# Patient Record
Sex: Female | Born: 1950 | Hispanic: Yes | Smoking: Never smoker
Health system: Southern US, Community
[De-identification: ages and names within clinical notes are randomized; demographics above are authoritative.]

## PROBLEM LIST (undated history)

## (undated) DIAGNOSIS — I10 Essential (primary) hypertension: Secondary | ICD-10-CM

## (undated) DIAGNOSIS — E119 Type 2 diabetes mellitus without complications: Secondary | ICD-10-CM

## (undated) DIAGNOSIS — E7800 Pure hypercholesterolemia, unspecified: Secondary | ICD-10-CM

## (undated) HISTORY — PX: ABDOMINAL HYSTERECTOMY: SHX81

## (undated) HISTORY — PX: CATARACT EXTRACTION: SUR2

---

## 2007-11-16 ENCOUNTER — Ambulatory Visit: Payer: Self-pay | Admitting: Family Medicine

## 2010-05-15 ENCOUNTER — Ambulatory Visit: Payer: Self-pay

## 2012-02-24 ENCOUNTER — Emergency Department: Payer: Self-pay | Admitting: Emergency Medicine

## 2012-04-27 ENCOUNTER — Ambulatory Visit: Payer: Self-pay

## 2013-01-11 ENCOUNTER — Emergency Department: Payer: Self-pay | Admitting: Emergency Medicine

## 2013-05-20 ENCOUNTER — Ambulatory Visit: Payer: Self-pay | Admitting: Family Medicine

## 2013-07-09 ENCOUNTER — Emergency Department: Payer: Self-pay | Admitting: Emergency Medicine

## 2013-07-09 LAB — COMPREHENSIVE METABOLIC PANEL
Alkaline Phosphatase: 72 U/L (ref 50–136)
Anion Gap: 2 — ABNORMAL LOW (ref 7–16)
BUN: 24 mg/dL — ABNORMAL HIGH (ref 7–18)
Bilirubin,Total: 0.3 mg/dL (ref 0.2–1.0)
Chloride: 107 mmol/L (ref 98–107)
Co2: 33 mmol/L — ABNORMAL HIGH (ref 21–32)
EGFR (African American): >60
EGFR (Non-African Amer.): >60
Glucose: 137 mg/dL — ABNORMAL HIGH (ref 65–99)
Osmolality: 289 (ref 275–301)
Potassium: 3.8 mmol/L (ref 3.5–5.1)
SGOT(AST): 22 U/L (ref 15–37)
Sodium: 142 mmol/L (ref 136–145)
Total Protein: 7.5 g/dL (ref 6.4–8.2)

## 2013-07-09 LAB — CBC WITH DIFFERENTIAL/PLATELET
Basophil %: 0.6 %
Eosinophil %: 4 %
HCT: 40.5 % (ref 35.0–47.0)
Lymphocyte #: 2.6 10*3/uL (ref 1.0–3.6)
Lymphocyte %: 33.1 %
MCH: 29.4 pg (ref 26.0–34.0)
MCV: 88 fL (ref 80–100)
Monocyte %: 9.3 %
Neutrophil #: 4.1 10*3/uL (ref 1.4–6.5)
Platelet: 212 10*3/uL (ref 150–440)
RDW: 13.6 % (ref 11.5–14.5)
WBC: 7.8 10*3/uL (ref 3.6–11.0)

## 2013-07-09 LAB — TROPONIN I
Troponin-I: <0.02 ng/mL
Troponin-I: <0.02 ng/mL

## 2014-07-24 ENCOUNTER — Ambulatory Visit: Payer: Self-pay | Admitting: Family Medicine

## 2016-10-28 ENCOUNTER — Other Ambulatory Visit: Payer: Self-pay | Admitting: Family Medicine

## 2016-10-28 DIAGNOSIS — Z1231 Encounter for screening mammogram for malignant neoplasm of breast: Secondary | ICD-10-CM

## 2016-12-04 ENCOUNTER — Ambulatory Visit
Admission: RE | Admit: 2016-12-04 | Discharge: 2016-12-04 | Disposition: A | Discharge status: Home / Self Care | Payer: BLUE CROSS/BLUE SHIELD | Source: Ambulatory Visit | Attending: Family Medicine | Admitting: Family Medicine

## 2016-12-04 DIAGNOSIS — Z1231 Encounter for screening mammogram for malignant neoplasm of breast: Secondary | ICD-10-CM

## 2017-04-30 ENCOUNTER — Other Ambulatory Visit: Payer: Self-pay | Admitting: Family Medicine

## 2017-04-30 DIAGNOSIS — Z13820 Encounter for screening for osteoporosis: Secondary | ICD-10-CM

## 2017-06-24 ENCOUNTER — Other Ambulatory Visit: Payer: BLUE CROSS/BLUE SHIELD

## 2017-11-09 ENCOUNTER — Other Ambulatory Visit: Payer: Self-pay | Admitting: Family Medicine

## 2017-11-09 DIAGNOSIS — Z1231 Encounter for screening mammogram for malignant neoplasm of breast: Secondary | ICD-10-CM

## 2017-12-17 ENCOUNTER — Ambulatory Visit
Admission: RE | Admit: 2017-12-17 | Discharge: 2017-12-17 | Disposition: A | Discharge status: Home / Self Care | Payer: Medicare Other | Source: Ambulatory Visit | Attending: Family Medicine | Admitting: Family Medicine

## 2017-12-17 ENCOUNTER — Encounter: Payer: Self-pay | Admitting: Radiology

## 2017-12-17 DIAGNOSIS — Z13820 Encounter for screening for osteoporosis: Secondary | ICD-10-CM | POA: Diagnosis present

## 2017-12-17 DIAGNOSIS — Z1231 Encounter for screening mammogram for malignant neoplasm of breast: Secondary | ICD-10-CM | POA: Insufficient documentation

## 2017-12-17 DIAGNOSIS — M85852 Other specified disorders of bone density and structure, left thigh: Secondary | ICD-10-CM | POA: Diagnosis not present

## 2017-12-17 DIAGNOSIS — M8588 Other specified disorders of bone density and structure, other site: Secondary | ICD-10-CM | POA: Diagnosis not present

## 2017-12-17 DIAGNOSIS — Z780 Asymptomatic menopausal state: Secondary | ICD-10-CM | POA: Diagnosis not present

## 2018-07-20 ENCOUNTER — Other Ambulatory Visit: Payer: Self-pay | Admitting: Family Medicine

## 2018-07-20 DIAGNOSIS — Z1231 Encounter for screening mammogram for malignant neoplasm of breast: Secondary | ICD-10-CM

## 2018-12-21 ENCOUNTER — Ambulatory Visit
Admission: RE | Admit: 2018-12-21 | Discharge: 2018-12-21 | Disposition: A | Discharge status: Home / Self Care | Payer: Medicare Other | Source: Ambulatory Visit | Attending: Family Medicine | Admitting: Family Medicine

## 2018-12-21 DIAGNOSIS — Z1231 Encounter for screening mammogram for malignant neoplasm of breast: Secondary | ICD-10-CM | POA: Insufficient documentation

## 2019-11-16 ENCOUNTER — Other Ambulatory Visit: Payer: Self-pay | Admitting: Family Medicine

## 2019-11-16 DIAGNOSIS — Z1231 Encounter for screening mammogram for malignant neoplasm of breast: Secondary | ICD-10-CM

## 2020-01-20 ENCOUNTER — Ambulatory Visit
Admission: RE | Admit: 2020-01-20 | Discharge: 2020-01-20 | Disposition: A | Discharge status: Home / Self Care | Payer: Medicare Other | Source: Ambulatory Visit | Attending: Family Medicine | Admitting: Family Medicine

## 2020-01-20 DIAGNOSIS — Z1231 Encounter for screening mammogram for malignant neoplasm of breast: Secondary | ICD-10-CM | POA: Insufficient documentation

## 2020-03-01 DIAGNOSIS — H25811 Combined forms of age-related cataract, right eye: Secondary | ICD-10-CM | POA: Insufficient documentation

## 2020-08-15 ENCOUNTER — Ambulatory Visit: Payer: Medicare Other | Admitting: Dermatology

## 2020-11-01 ENCOUNTER — Other Ambulatory Visit: Payer: Self-pay | Admitting: Family Medicine

## 2020-11-01 DIAGNOSIS — D249 Benign neoplasm of unspecified breast: Secondary | ICD-10-CM

## 2020-11-19 ENCOUNTER — Ambulatory Visit
Admission: RE | Admit: 2020-11-19 | Discharge: 2020-11-19 | Disposition: A | Discharge status: Home / Self Care | Payer: Medicare Other | Source: Ambulatory Visit | Attending: Family Medicine | Admitting: Family Medicine

## 2020-11-19 ENCOUNTER — Other Ambulatory Visit: Payer: Self-pay

## 2020-11-19 DIAGNOSIS — D1779 Benign lipomatous neoplasm of other sites: Secondary | ICD-10-CM | POA: Insufficient documentation

## 2020-11-19 DIAGNOSIS — D242 Benign neoplasm of left breast: Secondary | ICD-10-CM | POA: Diagnosis not present

## 2020-11-19 DIAGNOSIS — D249 Benign neoplasm of unspecified breast: Secondary | ICD-10-CM

## 2020-11-19 DIAGNOSIS — N6320 Unspecified lump in the left breast, unspecified quadrant: Secondary | ICD-10-CM | POA: Diagnosis present

## 2020-11-29 ENCOUNTER — Other Ambulatory Visit: Payer: Self-pay | Admitting: Family Medicine

## 2020-11-29 DIAGNOSIS — N63 Unspecified lump in unspecified breast: Secondary | ICD-10-CM

## 2021-10-25 ENCOUNTER — Other Ambulatory Visit: Payer: Self-pay | Admitting: Family Medicine

## 2021-10-25 DIAGNOSIS — Z1231 Encounter for screening mammogram for malignant neoplasm of breast: Secondary | ICD-10-CM

## 2021-11-29 ENCOUNTER — Other Ambulatory Visit: Payer: Self-pay

## 2021-11-29 ENCOUNTER — Ambulatory Visit
Admission: RE | Admit: 2021-11-29 | Discharge: 2021-11-29 | Disposition: A | Discharge status: Home / Self Care | Payer: Medicare HMO | Source: Ambulatory Visit | Attending: Family Medicine | Admitting: Family Medicine

## 2021-11-29 DIAGNOSIS — Z1231 Encounter for screening mammogram for malignant neoplasm of breast: Secondary | ICD-10-CM | POA: Diagnosis not present

## 2022-02-11 ENCOUNTER — Emergency Department
Admission: EM | Admit: 2022-02-11 | Discharge: 2022-02-11 | Disposition: A | Discharge status: Home / Self Care | Payer: Medicare HMO | Attending: Emergency Medicine | Admitting: Emergency Medicine

## 2022-02-11 ENCOUNTER — Emergency Department: Payer: Medicare HMO

## 2022-02-11 ENCOUNTER — Other Ambulatory Visit: Payer: Self-pay

## 2022-02-11 DIAGNOSIS — R220 Localized swelling, mass and lump, head: Secondary | ICD-10-CM | POA: Insufficient documentation

## 2022-02-11 DIAGNOSIS — R519 Headache, unspecified: Secondary | ICD-10-CM

## 2022-02-11 LAB — CBC WITH DIFFERENTIAL/PLATELET
Abs Immature Granulocytes: 0.02 10*3/uL (ref 0.00–0.07)
Basophils Absolute: 0 10*3/uL (ref 0.0–0.1)
Basophils Relative: 0 %
Eosinophils Absolute: 0.1 10*3/uL (ref 0.0–0.5)
Eosinophils Relative: 1 %
HCT: 39.4 % (ref 36.0–46.0)
Hemoglobin: 13 g/dL (ref 12.0–15.0)
Immature Granulocytes: 0 %
Lymphocytes Relative: 22 %
Lymphs Abs: 1.5 10*3/uL (ref 0.7–4.0)
MCH: 28.1 pg (ref 26.0–34.0)
MCHC: 33 g/dL (ref 30.0–36.0)
MCV: 85.1 fL (ref 80.0–100.0)
Monocytes Absolute: 0.4 10*3/uL (ref 0.1–1.0)
Monocytes Relative: 6 %
Neutro Abs: 4.8 10*3/uL (ref 1.7–7.7)
Neutrophils Relative %: 71 %
Platelets: 231 10*3/uL (ref 150–400)
RBC: 4.63 MIL/uL (ref 3.87–5.11)
RDW: 13.1 % (ref 11.5–15.5)
WBC: 6.8 10*3/uL (ref 4.0–10.5)
nRBC: 0 % (ref 0.0–0.2)

## 2022-02-11 LAB — BASIC METABOLIC PANEL
Anion gap: 8 (ref 5–15)
BUN: 14 mg/dL (ref 8–23)
CO2: 24 mmol/L (ref 22–32)
Calcium: 9.5 mg/dL (ref 8.9–10.3)
Chloride: 110 mmol/L (ref 98–111)
Creatinine, Ser: 0.76 mg/dL (ref 0.44–1.00)
GFR, Estimated: >60 mL/min (ref >60)
Glucose, Bld: 162 mg/dL — ABNORMAL HIGH (ref 70–99)
Potassium: 3.9 mmol/L (ref 3.5–5.1)
Sodium: 142 mmol/L (ref 135–145)

## 2022-02-11 NOTE — Discharge Instructions (Addendum)
Please continue to follow-up with your dentist.  Also follow-up with primary care.  There is a mucous cyst in the sinus but this does not usually cause any problems at all.  I do not think that this is causing the swelling.  Your blood work is normal and the CT does not show anything else.  I do not think there is any horrible problem going on but as I said your dentist and your primary care doctor should keep an eye on you.  Please return for any worsening.

## 2022-02-11 NOTE — ED Provider Notes (Signed)
[facility] Provider Note    Event Date/Time   First MD Initiated Contact with Patient 02/11/22 1100     (approximate)   History   Facial Swelling   HPI  Kaitlin Maxwell is a 71 y.o. female who complains of several weeks of left-sided facial swelling in the cheek going up to the ear.  She says she has some pain in her anterior incisor when she wears it bite guard at night but this came after the facial swelling. No fever.     Physical Exam   Triage Vital Signs: ED Triage Vitals  Enc Vitals Group     BP 02/11/22 1005 140/83     Pulse Rate 02/11/22 1005 86     Resp 02/11/22 1005 18     Temp 02/11/22 1005 97.9 F (36.6 C)     Temp Source 02/11/22 1005 Oral     SpO2 02/11/22 1005 96 %     Weight 02/11/22 1008 198 lb (89.8 kg)     Height 02/11/22 1008 5\' 3"  (1.6 m)     Head Circumference --      Peak Flow --      Pain Score 02/11/22 1008 0     Pain Loc --      Pain Edu? --      Excl. in [place]? --     Most recent vital signs: Vitals:   02/11/22 1005  BP: 140/83  Pulse: 86  Resp: 18  Temp: 97.9 F (36.6 C)  SpO2: 96%     General: Awake, no distress.  Head normocephalic atraumatic it is difficult to appreciate any swelling but there may be some on the left side in the area of the cheek. CV:  Good peripheral perfusion.  Resp:  Normal effort.  Abd:  No distention.  Ears TMs are clear mouth there is no tooth tenderness.  The upper teeth are somewhat worn there is no gum redness or swelling the tongue looks normal the pharynx looks normal Neck is supple no obvious nodes   ED Results / Procedures / Treatments   Labs (all labs ordered are listed, but only abnormal results are displayed) Labs Reviewed  BASIC METABOLIC PANEL - Abnormal; Notable for the following components:      Result Value   Glucose, Bld 162 (*)    All other components within normal limits  CBC WITH DIFFERENTIAL/PLATELET     EKG     RADIOLOGY  CT read by  radiology reviewed by me shows only mucous retention cyst in the right maxillary sinus.  According to up-to-date this should not be causing symptoms.  PROCEDURES:  Critical Care performed:   Procedures   MEDICATIONS ORDERED IN ED: Medications - No data to display   IMPRESSION / MDM / [place] / ED COURSE  I reviewed the triage vital signs and the nursing notes. Patient's labs and CT are normal.  It is possible though unlikely she is feeling some heaviness from the mucous retention cyst.  There is nothing else really that I see.  I do not believe there is any reason to admit her.  There are no symptoms of an allergic reaction there is no rash there is no itching it is difficult to even see any swelling on the patient and the CT does not look particularly swollen either.  I will let the patient go and have her follow-up with her doctor and her dentist.  I will have her return if  she is worse.        FINAL CLINICAL IMPRESSION(S) / ED DIAGNOSES   Final diagnoses:  Facial discomfort     Rx / DC Orders   ED Discharge Orders     None        Note:  This document was prepared using Dragon voice recognition software and may include unintentional dictation errors.   Nena Polio, MD 02/11/22 1235

## 2022-02-11 NOTE — ED Triage Notes (Signed)
Pt here with facial swelling to the left side of her face going up to her ear. Pt has had these symptoms for 2 weeks, pt has a bridge in her mouth on the left side that her dentist lowered but she is still having issues. Pt in NAD in triage.

## 2022-07-15 ENCOUNTER — Ambulatory Visit: Payer: Medicare HMO | Admitting: Dermatology

## 2022-07-15 DIAGNOSIS — L820 Inflamed seborrheic keratosis: Secondary | ICD-10-CM | POA: Diagnosis not present

## 2022-07-15 DIAGNOSIS — D225 Melanocytic nevi of trunk: Secondary | ICD-10-CM

## 2022-07-15 DIAGNOSIS — L720 Epidermal cyst: Secondary | ICD-10-CM

## 2022-07-15 DIAGNOSIS — D1800 Hemangioma unspecified site: Secondary | ICD-10-CM

## 2022-07-15 DIAGNOSIS — L719 Rosacea, unspecified: Secondary | ICD-10-CM

## 2022-07-15 DIAGNOSIS — L821 Other seborrheic keratosis: Secondary | ICD-10-CM | POA: Diagnosis not present

## 2022-07-15 MED ORDER — METRONIDAZOLE 0.75 % EX CREA: TOPICAL_CREAM | Freq: Two times a day (BID) | CUTANEOUS | 2 refills | Status: AC

## 2022-07-15 NOTE — Progress Notes (Unsigned)
New Patient Visit  Subjective  Kaitlin Maxwell is a 71 y.o. female who presents for the following: Skin Problem (Check a growths on her back and left arm changing, growing and irritating x 6 months ) and Rosacea (Hx of Rosacea on her face, treating with sun protection daily ).   The following portions of the chart were reviewed this encounter and updated as appropriate:   Allergies  Meds  Problems  Med Hx  Surg Hx  Fam Hx      Review of Systems:  No other skin or systemic complaints except as noted in HPI or Assessment and Plan.  Objective  Well appearing patient in no apparent distress; mood and affect are within normal limits.  A focused examination was performed including back,face,left arm,buttock . Relevant physical exam findings are noted in the Assessment and Plan.  face Mild erythema with rare resolving inflammatory papule  left back x 1, left forearm x 1 (2) Stuck-on, waxy, tan-brown papule --Discussed benign etiology and prognosis.   right gluteal crease Small white subcutaneous nodule.     Assessment & Plan  Rosacea face  Chronic and persistent condition with duration over one year. Condition is symptomatic/ bothersome to patient. Not currently at goal.  Rosacea is a chronic progressive skin condition usually affecting the face of adults, causing redness and/or acne bumps. It is treatable but not curable. It sometimes affects the eyes (ocular rosacea) as well. It may respond to topical and/or systemic medication and can flare with stress, sun exposure, alcohol, exercise and some foods.  Daily application of broad spectrum spf 30+ sunscreen to face is recommended to reduce flares.   Start Metronidazole cream apply to face twice a day   Related Medications metroNIDAZOLE (METROCREAM) 0.75 % cream Apply topically 2 (two) times daily.  Inflamed seborrheic keratosis (2) left back x 1, left forearm x 1  Symptomatic, irritating, patient would like treated.    Prior to procedure, discussed risks of blister formation, small wound, skin dyspigmentation, or rare scar following cryotherapy. Recommend Vaseline ointment to treated areas while healing.   Destruction of lesion - left back x 1, left forearm x 1 Complexity: simple   Destruction method: cryotherapy   Informed consent: discussed and consent obtained   Timeout:  patient name, date of birth, surgical site, and procedure verified Lesion destroyed using liquid nitrogen: Yes   Region frozen until ice ball extended beyond lesion: Yes   Outcome: patient tolerated procedure well with no complications   Post-procedure details: wound care instructions given    Epidermal inclusion cyst right gluteal crease  Vs pilomatricoma  Benign-appearing. Exam most consistent with an epidermal inclusion cyst. Discussed that a cyst is a benign growth that can grow over time and sometimes get irritated or inflamed. Recommend observation if it is not bothersome. Discussed option of surgical excision to remove it if it is growing, symptomatic, or other changes noted. Please call for new or changing lesions so they can be evaluated.    Melanocytic Nevi back - Tan-brown and/or pink-flesh-colored symmetric macules and papules - Benign appearing on exam today - Observation - Call clinic for new or changing moles - Recommend daily use of broad spectrum spf 30+ sunscreen to sun-exposed areas.   Melanocytic Nevi Left lower back  - Tan-brown and/or pink-flesh-colored symmetric macules and papules - Benign appearing on exam today - Observation - Call clinic for new or changing moles - Recommend daily use of broad spectrum spf 30+ sunscreen to sun-exposed areas.  Hemangiomas - Red papules - Discussed benign nature - Observe - Call for any changes   Seborrheic Keratoses Left arm  - Stuck-on, waxy, tan-brown papules and/or plaques  - Benign-appearing - Discussed benign etiology and prognosis. - Observe -  Call for any changes  Return in about 6 months (around 01/15/2023) for ISK, Rosacea .   I, Marye Round, CMA, am acting as scribe for Forest Gleason, MD .   Documentation: I have reviewed the above documentation for accuracy and completeness, and I agree with the above.  Forest Gleason, MD

## 2022-07-15 NOTE — Patient Instructions (Addendum)
Prior to procedure, discussed risks of blister formation, small wound, skin dyspigmentation, or rare scar following cryotherapy. Recommend Vaseline ointment to treated areas while healing.   Due to recent changes in healthcare laws, you may see results of your pathology and/or laboratory studies on MyChart before the doctors have had a chance to review them. We understand that in some cases there may be results that are confusing or concerning to you. Please understand that not all results are received at the same time and often the doctors may need to interpret multiple results in order to provide you with the best plan of care or course of treatment. Therefore, we ask that you please give us 2 business days to thoroughly review all your results before contacting the office for clarification. Should we see a critical lab result, you will be contacted sooner.   If You Need Anything After Your Visit  If you have any questions or concerns for your doctor, please call our main line at [phone] and press option 4 to reach your doctor's medical assistant. If no one answers, please leave a voicemail as directed and we will return your call as soon as possible. Messages left after 4 pm will be answered the following business day.   You may also send us a message via MyChart. We typically respond to MyChart messages within 1-2 business days.  For prescription refills, please ask your pharmacy to contact our office. Our fax number is [phone].  If you have an urgent issue when the clinic is closed that cannot wait until the next business day, you can page your doctor at the number below.    Please note that while we do our best to be available for urgent issues outside of office hours, we are not available 24/7.   If you have an urgent issue and are unable to reach us, you may choose to seek medical care at your doctor's office, retail clinic, urgent care center, or emergency room.  If you have a  medical emergency, please immediately call 911 or go to the emergency department.  Pager Numbers  - Dr. Kowalski: [phone]  - Dr. Moye: [phone]  - Dr. Stewart: [phone]  In the event of inclement weather, please call our main line at [phone] for an update on the status of any delays or closures.  Dermatology Medication Tips: Please keep the boxes that topical medications come in in order to help keep track of the instructions about where and how to use these. Pharmacies typically print the medication instructions only on the boxes and not directly on the medication tubes.   If your medication is too expensive, please contact our office at [phone] option 4 or send us a message through MyChart.   We are unable to tell what your co-pay for medications will be in advance as this is different depending on your insurance coverage. However, we may be able to find a substitute medication at lower cost or fill out paperwork to get insurance to cover a needed medication.   If a prior authorization is required to get your medication covered by your insurance company, please allow us 1-2 business days to complete this process.  Drug prices often vary depending on where the prescription is filled and some pharmacies may offer cheaper prices.  The website www.goodrx.com contains coupons for medications through different pharmacies. The prices here do not account for what the cost may be with help from insurance (it may be cheaper with your insurance),   but the website can give you the price if you did not use any insurance.  - You can print the associated coupon and take it with your prescription to the pharmacy.  - You may also stop by our office during regular business hours and pick up a GoodRx coupon card.  - If you need your prescription sent electronically to a different pharmacy, notify our office through [facility] MyChart or by phone at [phone] option 4.     Si  Usted Necesita Algo Despus de Su Visita  Tambin puede enviarnos un mensaje a travs de MyChart. Por lo general respondemos a los mensajes de MyChart en el transcurso de 1 a 2 das hbiles.  Para renovar recetas, por favor pida a su farmacia que se ponga en contacto con nuestra oficina. Nuestro nmero de fax es el [phone].  Si tiene un asunto urgente cuando la clnica est cerrada y que no puede esperar hasta el siguiente da hbil, puede llamar/localizar a su doctor(a) al nmero que aparece a continuacin.   Por favor, tenga en cuenta que aunque hacemos todo lo posible para estar disponibles para asuntos urgentes fuera del horario de oficina, no estamos disponibles las 24 horas del da, los 7 das de la semana.   Si tiene un problema urgente y no puede comunicarse con nosotros, puede optar por buscar atencin mdica  en el consultorio de su doctor(a), en una clnica privada, en un centro de atencin urgente o en una sala de emergencias.  Si tiene una emergencia mdica, por favor llame inmediatamente al 911 o vaya a la sala de emergencias.  Nmeros de bper  - Dr. Kowalski: [phone]  - Dra. Moye: [phone]  - Dra. Stewart: [phone]  En caso de inclemencias del tiempo, por favor llame a nuestra lnea principal al [phone] para una actualizacin sobre el estado de cualquier retraso o cierre.  Consejos para la medicacin en dermatologa: Por favor, guarde las cajas en las que vienen los medicamentos de uso tpico para ayudarle a seguir las instrucciones sobre dnde y cmo usarlos. Las farmacias generalmente imprimen las instrucciones del medicamento slo en las cajas y no directamente en los tubos del medicamento.   Si su medicamento es muy caro, por favor, pngase en contacto con nuestra oficina llamando al [phone] y presione la opcin 4 o envenos un mensaje a travs de MyChart.   No podemos decirle cul ser su copago por los medicamentos por adelantado ya que  esto es diferente dependiendo de la cobertura de su seguro. Sin embargo, es posible que podamos encontrar un medicamento sustituto a menor costo o llenar un formulario para que el seguro cubra el medicamento que se considera necesario.   Si se requiere una autorizacin previa para que su compaa de seguros cubra su medicamento, por favor permtanos de 1 a 2 das hbiles para completar este proceso.  Los precios de los medicamentos varan con frecuencia dependiendo del lugar de dnde se surte la receta y alguna farmacias pueden ofrecer precios ms baratos.  El sitio web www.goodrx.com tiene cupones para medicamentos de diferentes farmacias. Los precios aqu no tienen en cuenta lo que podra costar con la ayuda del seguro (puede ser ms barato con su seguro), pero el sitio web puede darle el precio si no utiliz ningn seguro.  - Puede imprimir el cupn correspondiente y llevarlo con su receta a la farmacia.  - Tambin puede pasar por nuestra oficina durante el horario de atencin regular y recoger una tarjeta de cupones   regular y recoger una tarjeta de cupones de GoodRx.  - Si necesita que su receta se enve electrnicamente a una farmacia diferente, informe a nuestra oficina a travs de MyChart de [place] o por telfono llamando al [phone] y presione la opcin 4.  

## 2022-07-16 ENCOUNTER — Encounter: Payer: Self-pay | Admitting: Dermatology

## 2022-07-23 DIAGNOSIS — E119 Type 2 diabetes mellitus without complications: Secondary | ICD-10-CM | POA: Insufficient documentation

## 2022-07-23 DIAGNOSIS — M25521 Pain in right elbow: Secondary | ICD-10-CM | POA: Insufficient documentation

## 2022-07-23 DIAGNOSIS — M25511 Pain in right shoulder: Secondary | ICD-10-CM | POA: Insufficient documentation

## 2022-07-23 DIAGNOSIS — M19011 Primary osteoarthritis, right shoulder: Secondary | ICD-10-CM | POA: Insufficient documentation

## 2022-11-10 ENCOUNTER — Encounter: Payer: Self-pay | Admitting: Family Medicine

## 2022-11-10 DIAGNOSIS — Z1231 Encounter for screening mammogram for malignant neoplasm of breast: Secondary | ICD-10-CM

## 2022-11-13 ENCOUNTER — Other Ambulatory Visit: Payer: Self-pay | Admitting: Family Medicine

## 2022-11-13 DIAGNOSIS — Z1231 Encounter for screening mammogram for malignant neoplasm of breast: Secondary | ICD-10-CM

## 2023-01-21 ENCOUNTER — Ambulatory Visit: Payer: Medicare HMO | Admitting: Dermatology

## 2023-02-03 ENCOUNTER — Ambulatory Visit
Admission: RE | Admit: 2023-02-03 | Discharge: 2023-02-03 | Disposition: A | Discharge status: Home / Self Care | Payer: 59 | Source: Ambulatory Visit | Attending: Family Medicine | Admitting: Family Medicine

## 2023-02-03 DIAGNOSIS — Z1231 Encounter for screening mammogram for malignant neoplasm of breast: Secondary | ICD-10-CM | POA: Diagnosis not present

## 2023-03-02 ENCOUNTER — Telehealth: Payer: Self-pay | Admitting: Gastroenterology

## 2023-03-02 NOTE — Telephone Encounter (Signed)
Pt would like to be called back when june sched opens

## 2023-03-26 ENCOUNTER — Ambulatory Visit (INDEPENDENT_AMBULATORY_CARE_PROVIDER_SITE_OTHER): Payer: 59 | Admitting: Gastroenterology

## 2023-03-26 ENCOUNTER — Encounter: Payer: Self-pay | Admitting: Gastroenterology

## 2023-03-26 ENCOUNTER — Other Ambulatory Visit: Payer: Self-pay

## 2023-03-26 VITALS — BP 119/77 | HR 73 | Temp 97.8°F | Ht 62.0 in | Wt 194.8 lb

## 2023-03-26 DIAGNOSIS — R1084 Generalized abdominal pain: Secondary | ICD-10-CM

## 2023-03-26 DIAGNOSIS — K5909 Other constipation: Secondary | ICD-10-CM

## 2023-03-26 DIAGNOSIS — Z791 Long term (current) use of non-steroidal anti-inflammatories (NSAID): Secondary | ICD-10-CM | POA: Diagnosis not present

## 2023-03-26 MED ORDER — OMEPRAZOLE 40 MG PO CPDR: 40.0000 mg | DELAYED_RELEASE_CAPSULE | Freq: Every day | ORAL | 0 refills | Status: DC

## 2023-03-26 NOTE — Progress Notes (Signed)
[name] MD, MRCP(U.K) [address]  [address]  [place], [place] [address]  Main: [phone]  Fax: [phone]   Gastroenterology Consultation  Referring Provider:     Oswaldo Conroy, MD Primary Care Physician:  Oswaldo Conroy, MD Primary Gastroenterologist:  Dr. [name]  Reason for Consultation:  GERD        HPI:   Kaitlin Maxwell is a 72 y.o. y/o female referred for consultation & management  by Dr. Hessie Diener, Earl Lagos, MD.    Patient speaks only Spanish and here with an interpreter.  She states that she has been having for about 3 weeks chest discomfort in terms of burning seen by ENT physician who gave her Pepcid 20 mg twice daily which has helped partly but not completely.  Denies any weight loss denies any dysphagia she has been taking naproxen on a daily basis long-term.  Also complains of constipation since starting on a GLP-1 medication.  She takes polyethylene glycol as needed not on a daily basis and this has been not helping her completely never had a colonoscopy she states she had a stool test on an annual basis.  No other complaints.  Presently she has no pain.  No no recent labs or endoscopy results available on epic No past medical history on file.  No past surgical history on file.  Prior to Admission medications   Medication Sig Start Date End Date Taking? Authorizing Provider  amLODipine (NORVASC) 10 MG tablet Take 10 mg by mouth daily.    [provider]  atorvastatin (LIPITOR) 20 MG tablet Take 20 mg by mouth daily.    [provider]  famotidine (PEPCID) 20 MG tablet Take 20 mg by mouth 2 (two) times daily. 03/20/23   [provider]  metroNIDAZOLE (METROCREAM) 0.75 % cream Apply topically 2 (two) times daily. 07/15/22 07/15/23  Sandi Mealy, MD    No family history on file.   Social History   Tobacco Use   Smoking status: Never   Smokeless tobacco: Never  Substance Use Topics   Alcohol use: Never    Drug use: Never    Allergies as of 03/26/2023 - Review Complete 03/26/2023  Allergen Reaction Noted   Hydrochlorothiazide Swelling and Other (See Comments) 10/20/2008   Hydrocodone-acetaminophen Other (See Comments), Nausea Only, and Nausea And Vomiting 11/23/2014   Lisinopril Other (See Comments) 10/20/2008   Codeine Nausea And Vomiting 02/11/2022   Other Other (See Comments) 03/26/2023   Penicillins Nausea And Vomiting 02/11/2022    Review of Systems:    All systems reviewed and negative except where noted in HPI.   Physical Exam:  BP 119/77   Pulse 73   Temp 97.8 F (36.6 C) (Oral)   Ht 5\' 2"  (1.575 m)   Wt 194 lb 12.8 oz (88.4 kg)   BMI 35.63 kg/m  No LMP recorded. Patient is postmenopausal. Psych:  Alert and cooperative. Normal mood and affect. General:   Alert,  Well-developed, well-nourished, pleasant and cooperative in NAD Head:  Normocephalic and atraumatic. Eyes:  Sclera clear, no icterus.   Conjunctiva pink. Ears:  Normal auditory acuity.   Neurologic:  Alert and oriented x3;  grossly normal neurologically. Psych:  Alert and cooperative. Normal mood and affect.  Imaging Studies: No results found.  Assessment and Plan:   Kaitlin Maxwell is a 72 y.o. y/o female has been referred for GERD her symptoms are possibly combination of GERD and dyspepsia with constipation likely due to commencement  of Trulicity.  She has never had a colonoscopy.  Plan 1.  Stop NSAIDs 2.  Stop Pepcid and start on Prilosec 40 mg once a day as a trial for 8 weeks if no better will require an EGD 3.  H. pylori breath test 4.  For her constipation likely due to Trulicity commence on MiraLAX on a daily basis rather than as needed and if no better next time would require a colonoscopy but I have stressed that she would require a colonoscopy anyways as she has never had 1 before.  If symptoms are progressing may need to consider a CAT scan down the road.   Follow up in 3 months with APP or  myself  Dr [name] MD,MRCP(U.K)

## 2023-03-26 NOTE — Patient Instructions (Addendum)
Por Favor no tomar Pepcid.  Por favor empieze a tomar Prilosec (Omeprazole 40 MG) una vez al dia por tres meses.

## 2023-03-28 LAB — H. PYLORI BREATH TEST: H pylori Breath Test: POSITIVE — AB

## 2023-03-29 NOTE — Progress Notes (Signed)
H pylori positive   Suggest quadruple bismuth based rx and recheck 6 weeks laterfor h pylori

## 2023-03-30 ENCOUNTER — Telehealth: Payer: Self-pay

## 2023-03-30 DIAGNOSIS — A048 Other specified bacterial intestinal infections: Secondary | ICD-10-CM

## 2023-03-30 MED ORDER — METRONIDAZOLE 250 MG PO TABS: 250.0000 mg | ORAL_TABLET | Freq: Three times a day (TID) | ORAL | 0 refills | Status: AC

## 2023-03-30 MED ORDER — OMEPRAZOLE 20 MG PO CPDR: 20.0000 mg | DELAYED_RELEASE_CAPSULE | Freq: Two times a day (BID) | ORAL | 0 refills | Status: DC

## 2023-03-30 MED ORDER — BISMUTH SUBSALICYLATE 262 MG PO TABS: 1.0000 | ORAL_TABLET | Freq: Four times a day (QID) | ORAL | 0 refills | Status: AC

## 2023-03-30 MED ORDER — TETRACYCLINE HCL 500 MG PO CAPS: 500.0000 mg | ORAL_CAPSULE | Freq: Four times a day (QID) | ORAL | 0 refills | Status: AC

## 2023-03-30 NOTE — Telephone Encounter (Signed)
Called patient to let her know that she is H Pylori positive and she is recommended to start taking antibiotics for 14 days. I let her know that 6 weeks after she is done with her antibiotics, she will need to come back and recheck her H pylori breath test to make sure that she no longer have the bacteria. Patient understood and had no further questions.

## 2023-03-30 NOTE — Telephone Encounter (Signed)
-----   Message from [name], MD sent at 03/29/2023 11:54 AM EDT ----- H pylori positive   Suggest quadruple bismuth based rx and recheck 6 weeks laterfor h pylori

## 2023-05-21 ENCOUNTER — Telehealth: Payer: Self-pay

## 2023-05-21 NOTE — Telephone Encounter (Signed)
Pt left a message with just her name, dob and phone number. She did not leave the reason for her call. I did see you called her in April. Please return call.

## 2023-05-21 NOTE — Telephone Encounter (Signed)
Called patient back and left her a voicemail to call me back.

## 2023-05-21 NOTE — Telephone Encounter (Signed)
Patient called me back and she wanted to know if she needed to schedule an appointment to repeat her H Pylori test. I told her the lab hours and that there was no need for an appointment. Patient understood and had no further questions.

## 2023-05-29 LAB — H. PYLORI BREATH TEST: H pylori Breath Test: NEGATIVE

## 2023-06-25 ENCOUNTER — Other Ambulatory Visit: Payer: Self-pay

## 2023-06-25 ENCOUNTER — Encounter: Payer: Self-pay | Admitting: Gastroenterology

## 2023-06-25 ENCOUNTER — Ambulatory Visit (INDEPENDENT_AMBULATORY_CARE_PROVIDER_SITE_OTHER): Payer: 59 | Admitting: Gastroenterology

## 2023-06-25 VITALS — BP 138/78 | HR 69 | Temp 98.7°F | Wt 193.0 lb

## 2023-06-25 DIAGNOSIS — Z1211 Encounter for screening for malignant neoplasm of colon: Secondary | ICD-10-CM

## 2023-06-25 DIAGNOSIS — K219 Gastro-esophageal reflux disease without esophagitis: Secondary | ICD-10-CM | POA: Diagnosis not present

## 2023-06-25 MED ORDER — NA SULFATE-K SULFATE-MG SULF 17.5-3.13-1.6 GM/177ML PO SOLN: 354.0000 mL | Freq: Once | ORAL | 0 refills | Status: AC

## 2023-06-25 MED ORDER — OMEPRAZOLE 20 MG PO CPDR: 20.0000 mg | DELAYED_RELEASE_CAPSULE | Freq: Every day | ORAL | 0 refills | Status: AC

## 2023-06-25 NOTE — Patient Instructions (Signed)
Dieta mediterrnea Mediterranean Diet El trmino "dieta mediterrnea" hace referencia a elecciones de alimentacin y de estilo de vida basadas en las tradiciones de los pases ubicados en las costas del mar Mediterrneo. Se centra en comer ms frutas, verduras, cereales integrales, legumbres, frutos secos, semillas y grasas cardiosaludables, y comer menos productos lcteos, carne, huevos y alimentos procesados con azcar agregada, sal y Steffanie Rainwater. Se ha demostrado que esta dieta ayuda a prevenir determinadas afecciones y a [employer] que padecen enfermedades crnicas, como enfermedades renales y cardacas. Consejos para seguir Consulting civil engineer las etiquetas de los alimentos Verificar el tamao de la porcin de los alimentos envasados. En el caso del arroz y la pasta, el tamao de la porcin se refiere a la cantidad de alimento cocido, no seco. Controlar la cantidad total de grasa de los alimentos envasados. Evite los que contienen grasas saturadas o trans. Lea la lista de ingredientes para determinar si los alimentos contienen azcares agregados, como jarabe de maz. Al ir de compras  Compre una variedad de alimentos que ofrezcan una dieta equilibrada, por ejemplo: Frutas y verduras frescas. Cereales, frijoles, frutos secos y semillas. Algunos de estos productos pueden estar disponibles sueltos o en grandes cantidades (a granel). Mariscos frescos. Aves y [place]. Productos lcteos con bajo contenido de [place]. Compre ingredientes frescos en lugar de alimentos preenvasados. Compre frutas y verduras de estacin frescas en mercados de granjeros locales. Compre frutas y verduras congeladas solas. Si no tiene acceso a Estate agent de buena calidad, compre camarones congelados precocidos o pescado en lata, como atn, salmn o sardinas. Surta su despensa para tener siempre determinados productos a mano, por ejemplo, aceite de oliva, atn en lata, tomates en lata, arroz, pasta y  frijoles. Al cocinar Cocine con aceite de oliva extra virgen en lugar de usar mantequilla u otros aceites vegetales. Use las carnes como guarnicin y las verduras o los cereales como plato principal. Esto significa consumir porciones pequeas de carne o agregarla a la pasta o a los guisos en pequeas cantidades. Use frijoles o verduras en lugar de carne en platos comunes como chili o lasaa. Experimente con diferentes mtodos de coccin. Intente asar, cocer al vapor y saltear verduras. Agregue verduras congeladas a sopas, guisos, pasta o arroz. Agregue frutos secos o semillas para cubrir la porcin de grasas saludables y protena vegetal en cada comida. Puede agregarlos a yogures, ensaladas o platos con verduras. Marine el pescado o las verduras con aceite de [place], [place] de [place], ajo y Science writer. Planificacin de las comidas Planifique una comida vegetariana un da a la semana. Si es posible, intente agregar [employer]. Coma mariscos dos o ms veces por semana. Tenga a mano bocadillos saludables, por ejemplo: Bastones de verduras con humus. Yogur griego. Mezcla de frutos secos y frutas. Consuma comidas equilibradas toda la semana. Esto puede comprender lo siguiente: Fruta: 2 o 3 porciones por Futures trader. Verdura: 4 o 5 porciones por Futures trader. Productos lcteos descremados: 2 porciones por Futures trader. Carne magra, de ave o de pescado: 1 porcin por Futures trader. Frijoles y legumbres: 2 o ms porciones por semana. Frutos secos y semillas: 1 o 2 porciones por Futures trader. Cereales integrales: 6 a 8 porciones por da. Aceite de oliva extra virgen: 3 o 4 porciones por da. Limite las carnes rojas y los dulces a solo unas porciones al mes. Estilo de vida  Cocine y coma en familia, cuando sea posible. Beba suficiente lquido como para Pharmacologist la orina de color amarillo plido.  Haga actividad fsica [employer]. Esto puede comprender lo siguiente: Ejercicios aerbicos, Information systems manager. Actividades recreativas, como jardinera, caminatas o tareas domsticas. Intente dormir de 7 a 8 horas todas las noches. Si el mdico se lo recomienda, consuma vino tinto con moderacin. Esto significa 1 vaso por da para mujeres que no estn embarazadas y 2 vasos por da para los hombres. Un vaso de vino equivale a 5 oz (150 ml). Qu alimentos debo comer? Frutas Manzanas. Damascos. Aguacate. Bayas. Bananas. Cerezas. Dtiles. Higos. Uvas. Limones. Meln. Naranjas. Duraznos. Ciruelas. Granada. Verduras Alcachofas. Remolachas. Brcoli. Repollo. Zanahorias. Christella Noa. Judas verdes. Acelga. Col rizada. Espinaca. Cebollas. Puerro. Guisantes. Calabaza. Tomates. Pimientos. Rbanos. Granos Pastas integrales. Arroz integral. Sharion Dove burgol. Polenta. Cuscs. Pan integral. Avena. Quinua. Carnes y otras protenas Armed forces operational officer. Almendras. Semillas de girasol. Piones. Manes. Bacalao. Salmn. Vieiras. Camarones. Atn. Tilapia. Almejas. Ostras. Huevos. Carne de ave sin piel. Lcteos Leche con bajo contenido de [place]. Queso. Yogur griego. Grasas y aceites Aceite de oliva extra virgen. Aceite de aguacate. Aceite de semillas de uva. [employer]. Vino tinto. T de hierbas. Dulces y postres Yogur griego con miel. Manzanas asadas. Peras asadas. Mezcla de frutos secos. Alios y condimentos [place]. Cilantro. Coriandro. Comino. Menta. Perejil. Salvia. Lonna Cobb. Estragn. Ajo. Organo. Tomillo. Pimienta. Aceto balsmico. Tahini. Hummus. Salsa de tomate. Aceitunas. Hongos. Es posible que los productos que se enumeran ms [place] no constituyan una lista completa de los alimentos y las bebidas que puede tomar. Consulte a un nutricionista para obtener ms informacin. Qu alimentos debo limitar? Esta es una lista de los alimentos que se deben comer con muy poca frecuencia o solo en ocasiones especiales. Nils Pyle Frutas enlatadas con almbar. Verduras Papas fritas. Granos Comidas con arroz o pasta preenvasada.  Cereales preenvasados con azcar agregada. Colaciones preenvasadas con azcar agregada. Carnes y [address] protenas Carne de vaca. Cerdo. Cordero. Carne de ave con piel. Perros calientes. Tocino. Lcteos Helado. Tami Ribas. Leche entera. Grasas y [employer]. Aceite de canola. Aceite vegetal. Grasa de carne de res. Manteca de cerdo. Bebidas Jugos. Refrescos endulzados con azcar. Cerveza. Licores y bebidas espirituosas. Dulces y [employer]. Bizcochuelos. Pasteles. Caramelos. Alios y condimentos [place]ones[place]. Salsas y adobos listos para consumir. Es posible que los productos que se enumeran ms [place] no constituyan una lista completa de los alimentos y las bebidas que debe limitar. Consulte a un nutricionista para obtener ms informacin. Resumen La dieta mediterrnea implica elecciones de alimentos y de estilo de vida. Consuma una variedad de frutas y verduras frescas, frijoles, frutos secos, semillas y cereales integrales. Limite la cantidad de carnes rojas y dulces. Si el mdico se lo recomienda, consuma vino tinto con moderacin. Esto significa 1 vaso por da para mujeres que no estn embarazadas y 2 vasos por da para los hombres. Un vaso de vino equivale a 5 oz (150 ml). Esta informacin no tiene Theme park manager el consejo del mdico. Asegrese de hacerle al mdico cualquier pregunta que tenga. Document Revised: 07/02/2021 Document Reviewed: 01/06/2020 Elsevier Patient Education  2023 [employer].

## 2023-06-25 NOTE — Addendum Note (Signed)
Addended by: Adela Ports on: 06/25/2023 02:41 PM   Modules accepted: Orders

## 2023-06-25 NOTE — Progress Notes (Signed)
[name] MD, MRCP(U.K) [address]  [address]  [place], [place] [address]  Main: [phone]  Fax: [phone]   Primary Care Physician: Oswaldo Conroy, MD  Primary Gastroenterologist:  Dr. [name]   Chief Complaint  Patient presents with   Gastroesophageal Reflux    HPI: Kaitlin Maxwell is a 72 y.o. female   Summary of history : She referred and seen in April 2024 for GERD.  Speaks only Spanish came in with interpreter adding chest discomfort and some form of burning for few weeks seen by ENT was started on Pepcid.  She had been taking naproxen on a daily basis started on GLP-1 medication.  Never had a colonoscopy.   Interval history   03/26/2023-06/25/2023 commenced her on Prilosec 40 mg once a day as a trial H. pylori breath test was positive in April treated her with quadruple based therapy and H. pylori was confirmed negative in June 2024.  Treated her constipation with MiraLAX on a daily basis  Since the last visit she is doing very well no abdominal discomfort no chest discomfort on Prilosec 40 mg once a day.  Constipation also has resolved.   Current Outpatient Medications  Medication Sig Dispense Refill   amLODipine (NORVASC) 10 MG tablet Take 10 mg by mouth daily.     atorvastatin (LIPITOR) 20 MG tablet Take 20 mg by mouth daily.     metroNIDAZOLE (METROCREAM) 0.75 % cream Apply topically 2 (two) times daily. 45 g 2   omeprazole (PRILOSEC) 20 MG capsule Take 1 capsule (20 mg total) by mouth 2 (two) times daily before a meal. 28 capsule 0   omeprazole (PRILOSEC) 40 MG capsule Take 1 capsule (40 mg total) by mouth daily. 90 capsule 0   No current facility-administered medications for this visit.    Allergies as of 06/25/2023 - Review Complete 06/25/2023  Allergen Reaction Noted   Hydrochlorothiazide Swelling and Other (See Comments) 10/20/2008   Hydrocodone-acetaminophen Other (See Comments), Nausea Only, and Nausea And Vomiting 11/23/2014    Lisinopril Other (See Comments) 10/20/2008   Codeine Nausea And Vomiting 02/11/2022   Other Other (See Comments) 03/26/2023   Penicillins Nausea And Vomiting 02/11/2022      ROS:  General: Negative for anorexia, weight loss, fever, chills, fatigue, weakness. ENT: Negative for hoarseness, difficulty swallowing , nasal congestion. CV: Negative for chest pain, angina, palpitations, dyspnea on exertion, peripheral edema.  Respiratory: Negative for dyspnea at rest, dyspnea on exertion, cough, sputum, wheezing.  GI: See history of present illness. GU:  Negative for dysuria, hematuria, urinary incontinence, urinary frequency, nocturnal urination.  Endo: Negative for unusual weight change.    Physical Examination:   BP (!) 143/83   Pulse 83   Temp 98.7 F (37.1 C) (Oral)   Wt 193 lb (87.5 kg)   BMI 35.30 kg/m   General: Well-nourished, well-developed in no acute distress.  Eyes: No icterus. Conjunctivae pink. Neuro: Alert and oriented x 3.  Grossly intact. Skin: Warm and dry, no jaundice.   Psych: Alert and cooperative, normal mood and affect.   Imaging Studies: No results found.  Assessment and Plan:   Kaitlin Maxwell is a 72 y.o. y/o female to follow-up cd frvfgvfor GERD her symptoms are possibly combination of GERD and dyspepsia with constipation likely due to commencement of Trulicity.  She has never had a colonoscopy.   Plan 1.  Continue Prilosec 40 mg once a day will reduce to 20 mg once a day follow-up visit  we will try to transition her to Pepcid. 2.  EGD in view of longstanding symptoms and colonoscopy for colon cancer screening as she has never had 1 3.  Just to lose weight follow a Mediterranean diet exercise.   I have discussed alternative options, risks & benefits,  which include, but are not limited to, bleeding, infection, perforation,respiratory complication & drug reaction.  The patient agrees with this plan & written consent will be obtained.      Dr [name]  MD,MRCP [facility]) Follow up in 3 months with PA

## 2023-08-02 IMAGING — MG MM DIGITAL SCREENING BILAT W/ TOMO AND CAD
6 of 10 series · 6 of 30 positions shown · non-contrast
Comparison: Previous exam(s).

CLINICAL DATA: Screening.

EXAM:
DIGITAL SCREENING BILATERAL MAMMOGRAM WITH TOMOSYNTHESIS AND CAD
TECHNIQUE: Bilateral screening digital craniocaudal and mediolateral oblique
mammograms were obtained. Bilateral screening digital breast
tomosynthesis was performed. The images were evaluated with
computer-aided detection.

[L MLO synth-2D]
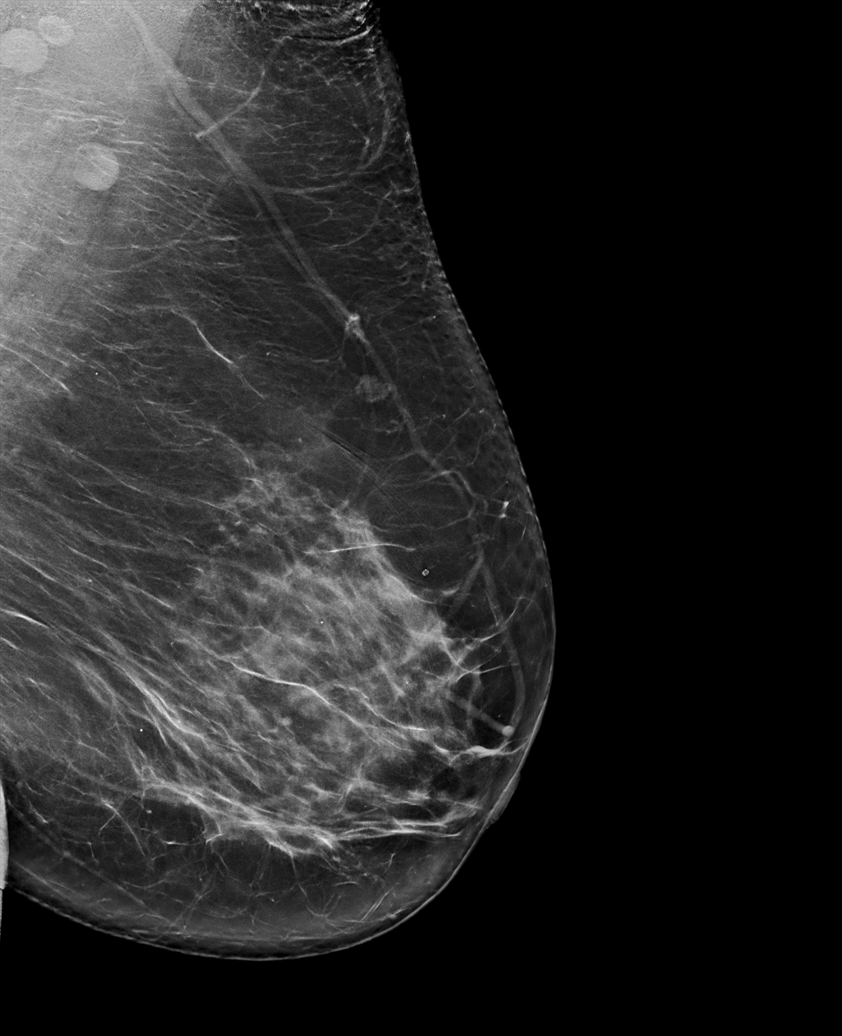

[R MLO synth-2D]
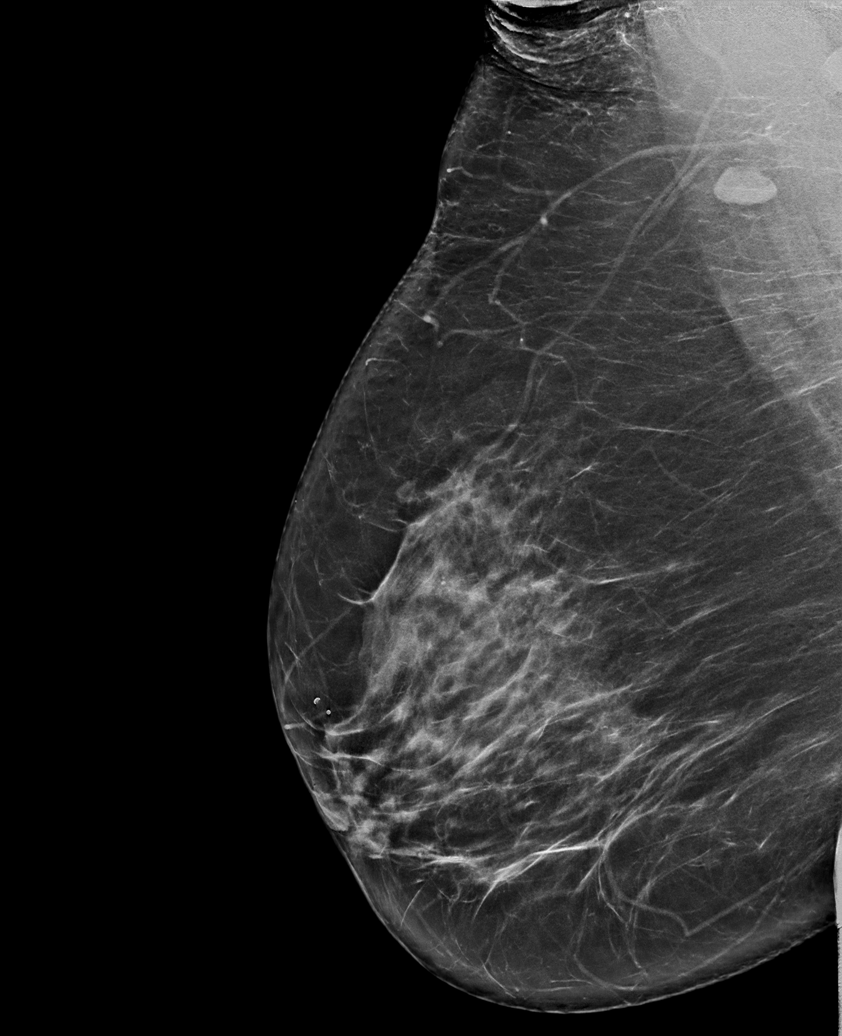

[L CC synth-2D]
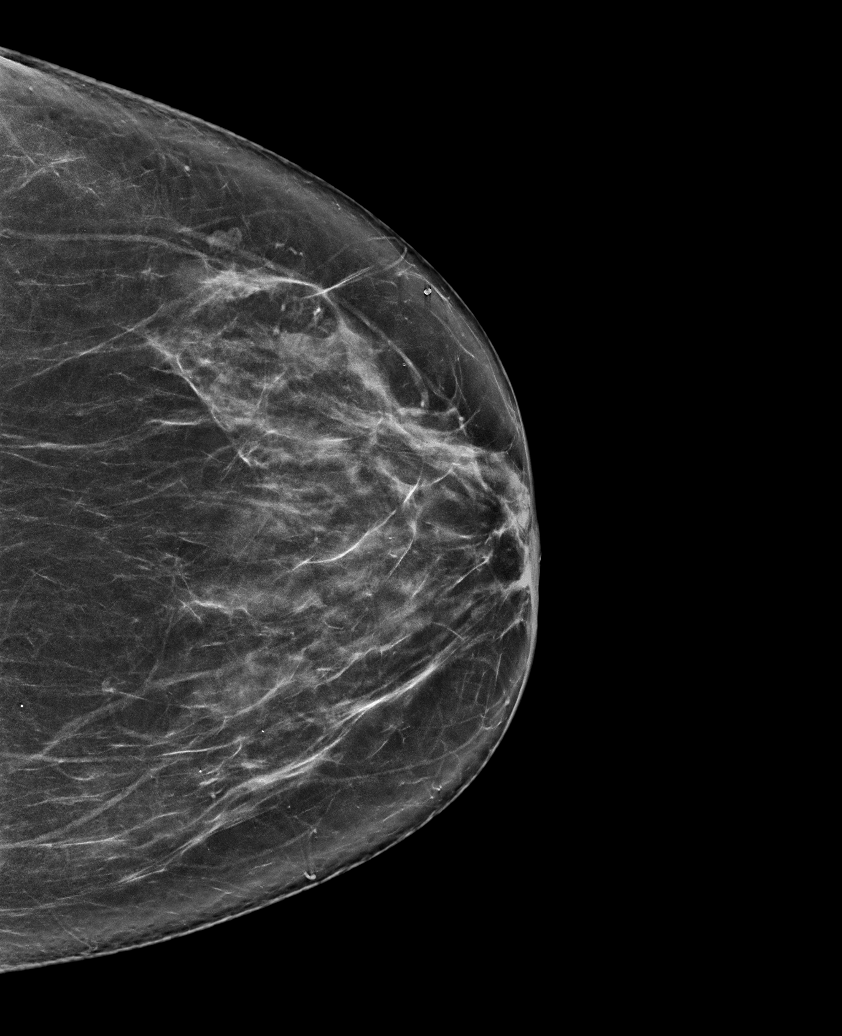

[R CC synth-2D (1 of 2)]
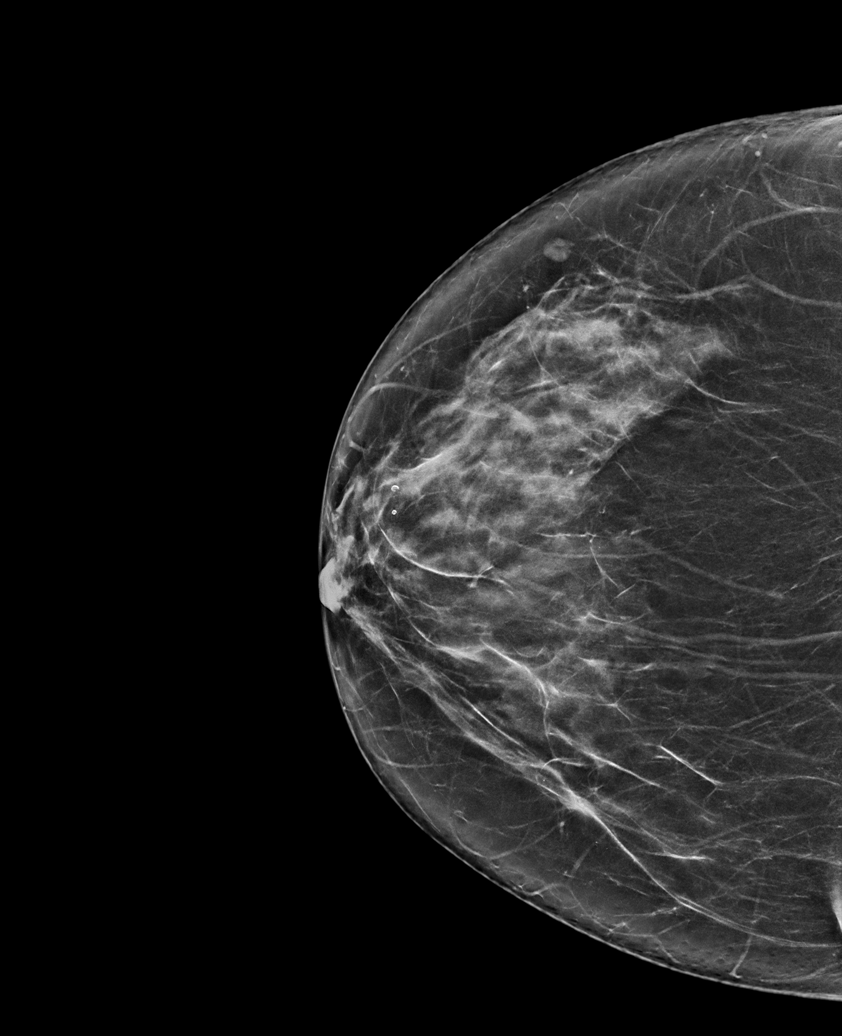

[R CC synth-2D (2 of 2)]
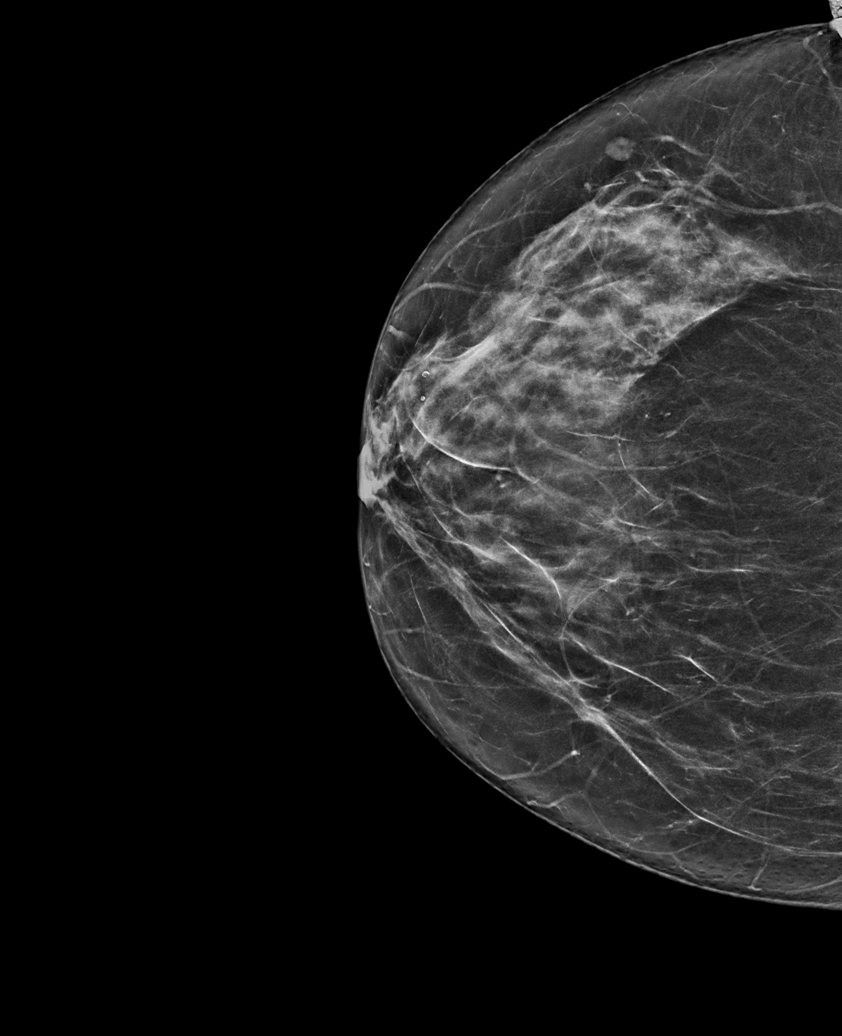

[R MLO tomo · tomo slice 44/87.0]
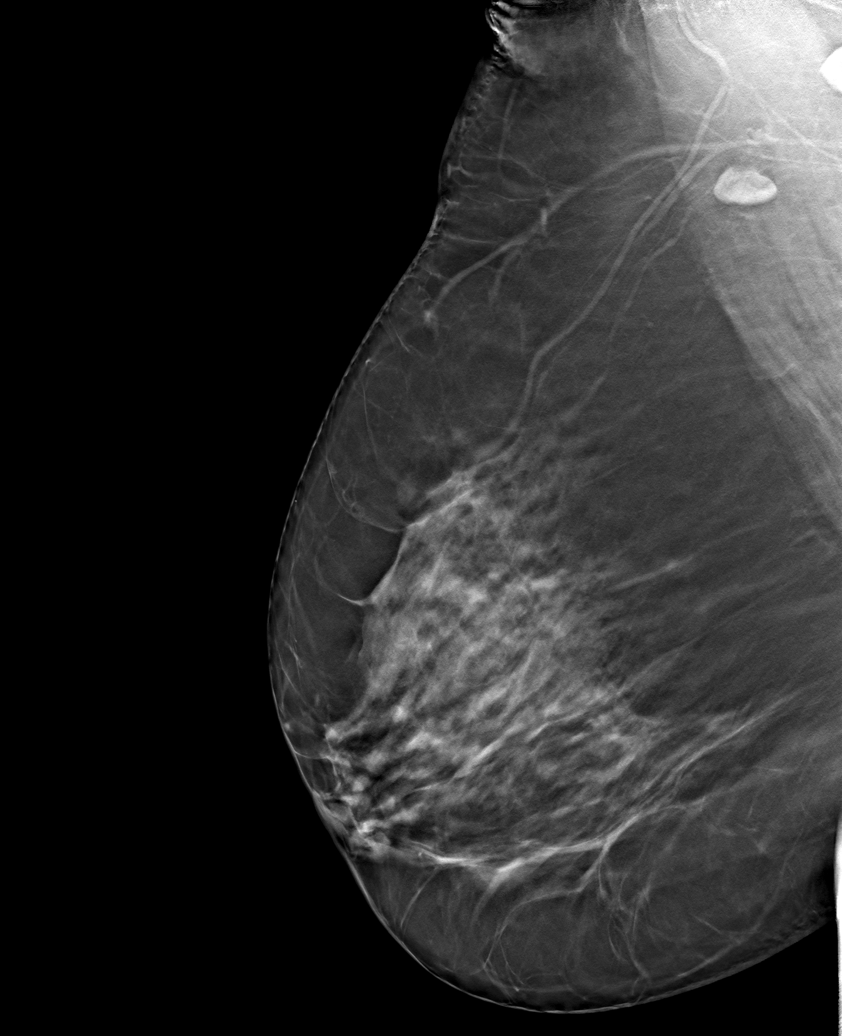

[6 of 30 positions shown; findings below may reference images not displayed]

ACR Breast Density Category b: There are scattered areas of
fibroglandular density.
FINDINGS: There are no findings suspicious for malignancy.
IMPRESSION: No mammographic evidence of malignancy. A result letter of this
screening mammogram will be mailed directly to the patient.

RECOMMENDATION:
Screening mammogram in one year. (Code:51-O-LD2)

BI-RADS CATEGORY  1: Negative.

## 2023-08-14 ENCOUNTER — Ambulatory Visit: Payer: 59 | Admitting: Registered Nurse

## 2023-08-14 ENCOUNTER — Encounter: Payer: Self-pay | Admitting: Gastroenterology

## 2023-08-14 ENCOUNTER — Ambulatory Visit
Admission: RE | Admit: 2023-08-14 | Discharge: 2023-08-14 | Disposition: A | Discharge status: Home / Self Care | Payer: 59 | Attending: Gastroenterology | Admitting: Gastroenterology

## 2023-08-14 ENCOUNTER — Encounter
Admission: RE | Disposition: A | Discharge status: Home / Self Care | Payer: Self-pay | Source: Home / Self Care | Attending: Gastroenterology

## 2023-08-14 DIAGNOSIS — R1013 Epigastric pain: Secondary | ICD-10-CM | POA: Insufficient documentation

## 2023-08-14 DIAGNOSIS — Z1211 Encounter for screening for malignant neoplasm of colon: Secondary | ICD-10-CM | POA: Diagnosis not present

## 2023-08-14 DIAGNOSIS — D126 Benign neoplasm of colon, unspecified: Secondary | ICD-10-CM

## 2023-08-14 DIAGNOSIS — K219 Gastro-esophageal reflux disease without esophagitis: Secondary | ICD-10-CM | POA: Diagnosis not present

## 2023-08-14 DIAGNOSIS — D122 Benign neoplasm of ascending colon: Secondary | ICD-10-CM | POA: Insufficient documentation

## 2023-08-14 HISTORY — PX: COLONOSCOPY WITH PROPOFOL: SHX5780

## 2023-08-14 HISTORY — DX: Type 2 diabetes mellitus without complications: E11.9

## 2023-08-14 HISTORY — DX: Essential (primary) hypertension: I10

## 2023-08-14 HISTORY — DX: Pure hypercholesterolemia, unspecified: E78.00

## 2023-08-14 HISTORY — PX: ESOPHAGOGASTRODUODENOSCOPY (EGD) WITH PROPOFOL: SHX5813

## 2023-08-14 HISTORY — PX: POLYPECTOMY: SHX5525

## 2023-08-14 LAB — GLUCOSE, CAPILLARY: Glucose-Capillary: 175 mg/dL — ABNORMAL HIGH (ref 70–99)

## 2023-08-14 SURGERY — ESOPHAGOGASTRODUODENOSCOPY (EGD) WITH PROPOFOL
Anesthesia: General

## 2023-08-14 MED ORDER — PHENYLEPHRINE 80 MCG/ML (10ML) SYRINGE FOR IV PUSH (FOR BLOOD PRESSURE SUPPORT)
PREFILLED_SYRINGE | INTRAVENOUS | Status: AC
  Filled 2023-08-14: qty 10

## 2023-08-14 MED ORDER — GLYCOPYRROLATE 0.2 MG/ML IJ SOLN
INTRAMUSCULAR | Status: AC
  Filled 2023-08-14: qty 1

## 2023-08-14 MED ORDER — PROPOFOL 1000 MG/100ML IV EMUL
INTRAVENOUS | Status: AC
  Filled 2023-08-14: qty 100

## 2023-08-14 MED ORDER — PROPOFOL 10 MG/ML IV BOLUS
INTRAVENOUS | Status: DC | PRN
  Administered 2023-08-14: 80 mg via INTRAVENOUS

## 2023-08-14 MED ORDER — GLYCOPYRROLATE 0.2 MG/ML IJ SOLN
INTRAMUSCULAR | Status: DC | PRN
  Administered 2023-08-14: .2 mg via INTRAVENOUS

## 2023-08-14 MED ORDER — PROPOFOL 500 MG/50ML IV EMUL
INTRAVENOUS | Status: DC | PRN
  Administered 2023-08-14: 150 ug/kg/min via INTRAVENOUS

## 2023-08-14 MED ORDER — PHENYLEPHRINE HCL (PRESSORS) 10 MG/ML IV SOLN
INTRAVENOUS | Status: DC | PRN
  Administered 2023-08-14 (×2): 160 ug via INTRAVENOUS
  Administered 2023-08-14 (×2): 80 ug via INTRAVENOUS

## 2023-08-14 MED ORDER — LIDOCAINE HCL (CARDIAC) PF 100 MG/5ML IV SOSY
PREFILLED_SYRINGE | INTRAVENOUS | Status: DC | PRN
  Administered 2023-08-14: 40 mg via INTRAVENOUS

## 2023-08-14 MED ORDER — DEXMEDETOMIDINE HCL IN NACL 200 MCG/50ML IV SOLN
INTRAVENOUS | Status: DC | PRN
  Administered 2023-08-14: 8 ug via INTRAVENOUS

## 2023-08-14 MED ORDER — SODIUM CHLORIDE 0.9 % IV SOLN: INTRAVENOUS | Status: DC

## 2023-08-14 NOTE — H&P (Signed)
     [name], MD [address], [address], [place], [place], [address] [address], [place], [place], [address] Phone: [phone]  Fax: [phone]  Primary Care Physician:  Oswaldo Conroy, MD   Pre-Procedure History & Physical: HPI:  Kaitlin Maxwell is a 72 y.o. female is here for an endoscopy and colonoscopy    Past Medical History:  Diagnosis Date   Diabetes mellitus without complication (HCC)    Hypertension     No past surgical history on file.  Prior to Admission medications   Medication Sig Start Date End Date Taking? Authorizing Provider  amLODipine (NORVASC) 10 MG tablet Take 10 mg by mouth daily.   Yes [provider]  Dulaglutide (TRULICITY) 1.5 MG/0.5ML SOPN Inject into the skin.   Yes [provider]  omeprazole (PRILOSEC) 20 MG capsule Take 1 capsule (20 mg total) by mouth daily. 06/25/23  Yes [name], MD  atorvastatin (LIPITOR) 20 MG tablet Take 20 mg by mouth daily.    [provider]    Allergies as of 06/26/2023 - Review Complete 06/25/2023  Allergen Reaction Noted   Hydrochlorothiazide Swelling and Other (See Comments) 10/20/2008   Hydrocodone-acetaminophen Other (See Comments), Nausea Only, and Nausea And Vomiting 11/23/2014   Lisinopril Other (See Comments) 10/20/2008   Codeine Nausea And Vomiting 02/11/2022   Other Other (See Comments) 03/26/2023   Penicillins Nausea And Vomiting 02/11/2022    No family history on file.  Social History   Socioeconomic History   Marital status: Divorced    Spouse name: Not on file   Number of children: Not on file   Years of education: Not on file   Highest education level: Not on file  Occupational History   Not on file  Tobacco Use   Smoking status: Never   Smokeless tobacco: Never  Substance and Sexual Activity   Alcohol use: Never   Drug use: Never   Sexual activity: Not on file  Other Topics Concern   Not on file  Social History Narrative   Not on  file   Social Determinants of Health   Financial Resource Strain: Not on file  Food Insecurity: Not on file  Transportation Needs: Not on file  Physical Activity: Not on file  Stress: Not on file  Social Connections: Not on file  Intimate Partner Violence: Not on file    Review of Systems: See HPI, otherwise negative ROS  Physical Exam: There were no vitals taken for this visit. General:   Alert,  pleasant and cooperative in NAD Head:  Normocephalic and atraumatic. Neck:  Supple; no masses or thyromegaly. Lungs:  Clear throughout to auscultation, normal respiratory effort.    Heart:  +S1, +S2, Regular rate and rhythm, No edema. Abdomen:  Soft, nontender and nondistended. Normal bowel sounds, without guarding, and without rebound.   Neurologic:  Alert and  oriented x4;  grossly normal neurologically.  Impression/Plan: Kaitlin Maxwell is here for an endoscopy and colonoscopy  to be performed for  evaluation of dyspesia and colon cancer screening     Risks, benefits, limitations, and alternatives regarding endoscopy have been reviewed with the patient.  Questions have been answered.  All parties agreeable.   [name], MD  08/14/2023, 8:20 AM

## 2023-08-14 NOTE — Anesthesia Preprocedure Evaluation (Signed)
Anesthesia Evaluation  Patient identified by MRN, date of birth, ID band Patient awake    Reviewed: Allergy & Precautions, H&P , NPO status , Patient's Chart, lab work & pertinent test results, reviewed documented beta blocker date and time   History of Anesthesia Complications Negative for: history of anesthetic complications  Airway Mallampati: II  TM Distance: >3 FB Neck ROM: full    Dental  (+) Dental Advidsory Given, Teeth Intact, Caps Bridge:   Pulmonary neg pulmonary ROS, Continuous Positive Airway Pressure Ventilation    Pulmonary exam normal breath sounds clear to auscultation       Cardiovascular Exercise Tolerance: Good hypertension, (-) angina (-) Past MI and (-) Cardiac Stents Normal cardiovascular exam(-) dysrhythmias (-) Valvular Problems/Murmurs Rhythm:regular Rate:Normal     Neuro/Psych negative neurological ROS  negative psych ROS   GI/Hepatic Neg liver ROS,GERD  ,,  Endo/Other  diabetes    Renal/GU negative Renal ROS  negative genitourinary   Musculoskeletal   Abdominal   Peds  Hematology negative hematology ROS (+)   Anesthesia Other Findings Past Medical History: No date: Diabetes mellitus without complication (HCC) No date: High cholesterol No date: Hypertension   Reproductive/Obstetrics negative OB ROS                             Anesthesia Physical Anesthesia Plan  ASA: 2  Anesthesia Plan: General   Post-op Pain Management:    Induction: Intravenous  PONV Risk Score and Plan: 3 and Propofol infusion and TIVA  Airway Management Planned: Natural Airway and Nasal Cannula  Additional Equipment:   Intra-op Plan:   Post-operative Plan:   Informed Consent: I have reviewed the patients History and Physical, chart, labs and discussed the procedure including the risks, benefits and alternatives for the proposed anesthesia with the patient or authorized  representative who has indicated his/her understanding and acceptance.     Dental Advisory Given  Plan Discussed with: Anesthesiologist, CRNA and Surgeon  Anesthesia Plan Comments:         Anesthesia Quick Evaluation

## 2023-08-14 NOTE — Anesthesia Procedure Notes (Signed)
Date/Time: 08/14/2023 9:11 AM  Performed by: Stormy Fabian, CRNAPre-anesthesia Checklist: Patient identified, Emergency Drugs available, Suction available and Patient being monitored Patient Re-evaluated:Patient Re-evaluated prior to induction Oxygen Delivery Method: Nasal cannula Induction Type: IV induction Dental Injury: Teeth and Oropharynx as per pre-operative assessment  Comments: Nasal cannula with etCO2 monitoring

## 2023-08-14 NOTE — Op Note (Signed)
[facility] Gastroenterology Patient Name: Kaitlin Maxwell Procedure Date: 08/14/2023 9:05 AM MRN: [id number] Account #: [id number] Date of Birth: [birth year] Admit Type: Outpatient Age: 72 Room: [facility] ENDO ROOM 4 Gender: Female Note Status: Finalized Instrument Name: Prentice Docker [id number] Procedure:             Colonoscopy Indications:           Screening for colorectal malignant neoplasm Providers:             [name] MD, MD Referring MD:          Oswaldo Done. Hessie Diener MD, MD (Referring MD) Medicines:             Monitored Anesthesia Care Complications:         No immediate complications. Procedure:             Pre-Anesthesia Assessment:                        - Prior to the procedure, a History and Physical was                         performed, and patient medications, allergies and                         sensitivities were reviewed. The patient's tolerance                         of previous anesthesia was reviewed.                        - The risks and benefits of the procedure and the                         sedation options and risks were discussed with the                         patient. All questions were answered and informed                         consent was obtained.                        - ASA Grade Assessment: II - A patient with mild                         systemic disease.                        After obtaining informed consent, the colonoscope was                         passed under direct vision. Throughout the procedure,                         the patient's blood pressure, pulse, and oxygen                         saturations were monitored continuously. The                         Colonoscope  was introduced through the anus and                         advanced to the the cecum, identified by the                         appendiceal orifice. The colonoscopy was performed                         with ease. The patient tolerated the procedure well.                          The quality of the bowel preparation was good. The                         ileocecal valve, appendiceal orifice, and rectum were                         photographed. Findings:      The perianal and digital rectal examinations were normal.      A 7 mm polyp was found in the sigmoid colon. The polyp was sessile. The       polyp was removed with a cold snare. Resection and retrieval were       complete. To prevent bleeding after the polypectomy, two hemostatic       clips were successfully placed. There was no bleeding at the end of the       procedure.      Three sessile polyps were found in the proximal ascending colon. The       polyps were 5 to 8 mm in size. These polyps were removed with a cold       snare. Resection and retrieval were complete.      The exam was otherwise without abnormality on direct and retroflexion       views. Impression:            - One 7 mm polyp in the sigmoid colon, removed with a                         cold snare. Resected and retrieved. Clips were placed.                        - Three 5 to 8 mm polyps in the proximal ascending                         colon, removed with a cold snare. Resected and                         retrieved.                        - The examination was otherwise normal on direct and                         retroflexion views. Recommendation:        - Discharge patient to home (with escort).                        - Resume previous diet.                        -  Continue present medications.                        - Await pathology results.                        - Repeat colonoscopy in 3 years for surveillance based                         on pathology results. Procedure Code(s):     --- Professional ---                        [phone], Colonoscopy, flexible; with removal of                         tumor(s), polyp(s), or other lesion(s) by snare                         technique Diagnosis Code(s):     ---  Professional ---                        Z12.11, Encounter for screening for malignant neoplasm                         of colon                        D12.5, Benign neoplasm of sigmoid colon                        D12.2, Benign neoplasm of ascending colon CPT copyright 2022 American Medical Association. All rights reserved. The codes documented in this report are preliminary and upon coder review may  be revised to meet current compliance requirements. [name], MD [name] MD, MD 08/14/2023 9:43:15 AM This report has been signed electronically. Number of Addenda: 0 Note Initiated On: 08/14/2023 9:05 AM Scope Withdrawal Time: 0 hours 18 minutes 57 seconds  Total Procedure Duration: 0 hours 20 minutes 19 seconds  Estimated Blood Loss:  Estimated blood loss: none.      [facility]

## 2023-08-14 NOTE — Transfer of Care (Signed)
Immediate Anesthesia Transfer of Care Note  Patient: Kaitlin Maxwell  Procedure(s) Performed: ESOPHAGOGASTRODUODENOSCOPY (EGD) WITH PROPOFOL COLONOSCOPY WITH PROPOFOL POLYPECTOMY  Patient Location: PACU and Endoscopy Unit  Anesthesia Type:General  Level of Consciousness: drowsy  Airway & Oxygen Therapy: Patient Spontanous Breathing  Post-op Assessment: Report given to RN and Post -op Vital signs reviewed and stable  Post vital signs: Reviewed and stable  Last Vitals:  Vitals Value Taken Time  BP 85/49 08/14/23 0949  Temp 36.2 C 08/14/23 0946  Pulse 83 08/14/23 0952  Resp 18 08/14/23 0952  SpO2 99 % 08/14/23 0952  Vitals shown include unfiled device data.  Last Pain:  Vitals:   08/14/23 0946  TempSrc: Temporal  PainSc: 0-No pain         Complications: No notable events documented.

## 2023-08-14 NOTE — Op Note (Addendum)
[facility] Gastroenterology Patient Name: Kaitlin Maxwell Procedure Date: 08/14/2023 9:05 AM MRN: [id number] Account #: [id number] Date of Birth: March 22, 1951 Admit Type: Outpatient Age: 72 Room: [facility] ENDO ROOM 4 Gender: Female Note Status: Finalized Instrument Name: Patton Salles Endoscope [id number] Procedure:             Upper GI endoscopy Indications:           Dyspepsia Providers:             [name] MD, MD Referring MD:          Oswaldo Done. Hessie Diener MD, MD (Referring MD) Medicines:             Monitored Anesthesia Care Complications:         No immediate complications. Procedure:             Pre-Anesthesia Assessment:                        - Prior to the procedure, a History and Physical was                         performed, and patient medications, allergies and                         sensitivities were reviewed. The patient's tolerance                         of previous anesthesia was reviewed.                        - The risks and benefits of the procedure and the                         sedation options and risks were discussed with the                         patient. All questions were answered and informed                         consent was obtained.                        - ASA Grade Assessment: II - A patient with mild                         systemic disease.                        After obtaining informed consent, the endoscope was                         passed under direct vision. Throughout the procedure,                         the patient's blood pressure, pulse, and oxygen                         saturations were monitored continuously. The Endoscope                         was  introduced through the mouth, and advanced to the                         third part of duodenum. The upper GI endoscopy was                         accomplished with ease. The patient tolerated the                         procedure well. Findings:      The esophagus was  normal.      The stomach was normal.      The examined duodenum was normal. Impression:            - Normal esophagus.                        - Normal stomach.                        - Normal examined duodenum.                        - No specimens collected. Recommendation:        - Perform a colonoscopy today. Procedure Code(s):     --- Professional ---                        [phone], Esophagogastroduodenoscopy, flexible,                         transoral; diagnostic, including collection of                         specimen(s) by brushing or washing, when performed                         (separate procedure) Diagnosis Code(s):     --- Professional ---                        R10.13, Epigastric pain CPT copyright 2022 American [employer] Association. All rights reserved. The codes documented in this report are preliminary and upon coder review may  be revised to meet current compliance requirements. [name], MD [name] MD, MD 08/14/2023 9:16:39 AM This report has been signed electronically. Number of Addenda: 0 Note Initiated On: 08/14/2023 9:05 AM Estimated Blood Loss:  Estimated blood loss: none.      [facility]

## 2023-08-18 ENCOUNTER — Encounter: Payer: Self-pay | Admitting: Gastroenterology

## 2023-08-20 NOTE — Anesthesia Postprocedure Evaluation (Signed)
Anesthesia Post Note  Patient: Engineer, civil (consulting)  Procedure(s) Performed: ESOPHAGOGASTRODUODENOSCOPY (EGD) WITH PROPOFOL COLONOSCOPY WITH PROPOFOL POLYPECTOMY  Patient location during evaluation: Endoscopy Anesthesia Type: General Level of consciousness: awake and alert Pain management: pain level controlled Vital Signs Assessment: post-procedure vital signs reviewed and stable Respiratory status: spontaneous breathing, nonlabored ventilation, respiratory function stable and patient connected to nasal cannula oxygen Cardiovascular status: blood pressure returned to baseline and stable Postop Assessment: no apparent nausea or vomiting Anesthetic complications: no   No notable events documented.   Last Vitals:  Vitals:   08/14/23 0946 08/14/23 1006  BP: (!) 85/49 105/69  Pulse: 79 80  Resp: 16   Temp: (!) 36.2 C   SpO2: 97% 100%    Last Pain:  Vitals:   08/14/23 1006  TempSrc:   PainSc: 0-No pain                 Lenard Simmer

## 2023-09-09 ENCOUNTER — Encounter: Payer: Self-pay | Admitting: Gastroenterology

## 2023-09-28 ENCOUNTER — Ambulatory Visit: Payer: 59 | Admitting: Physician Assistant

## 2023-09-29 ENCOUNTER — Encounter: Payer: Self-pay | Admitting: Gastroenterology

## 2023-09-29 ENCOUNTER — Other Ambulatory Visit: Payer: Self-pay

## 2023-09-29 ENCOUNTER — Ambulatory Visit (INDEPENDENT_AMBULATORY_CARE_PROVIDER_SITE_OTHER): Payer: 59 | Admitting: Gastroenterology

## 2023-09-29 VITALS — BP 131/77 | HR 66 | Temp 97.6°F | Ht 62.0 in | Wt 193.2 lb

## 2023-09-29 DIAGNOSIS — K219 Gastro-esophageal reflux disease without esophagitis: Secondary | ICD-10-CM

## 2023-09-29 NOTE — Patient Instructions (Addendum)
Por favor de parar Omeprazole y comprar Famotidine (Pepcid 40 MG 1 tableta al dia por un mes y despues parar) en su farmacia local.  Por favor de llamarnos si nos necesita. Gracias.

## 2023-09-29 NOTE — Progress Notes (Signed)
[name] MD, MRCP(U.K) [address]  [address]  [place], [place] [address]  Main: [phone]  Fax: [phone]   Primary Care Physician: Oswaldo Conroy, MD  Primary Gastroenterologist:  Dr. [name]   Chief Complaint  Patient presents with   Follow-up    HPI: Kaitlin Maxwell is a 72 y.o. female  Summary of history : She referred and seen in April 2024 for GERD.  Speaks only Spanish came in with interpreter adding chest discomfort and some form of burning for few weeks seen by ENT was started on Pepcid.  She had been taking naproxen on a daily basis started on GLP-1 medication.   commenced her on Prilosec 40 mg once a day as a trial H. pylori breath test was positive in April treated her with quadruple based therapy and H. pylori was confirmed negative in June 2024.  Treated her constipation with MiraLAX on a daily basis, this helped to resolve her abdominal discomfort as well as have constipation. Eradication of H. pylori confirmed by breath test on 05/27/2023   Interval history  06/25/2023-09/29/2023   08/14/2023: EGD: Normal 08/14/2023: Colonoscopy: 4 sessile polyps 5 to 8 mm resected: Sessile serrated adenoma and tubular adenoma.   Visit via interpreter she is doing well no complaints on Prilosec has reduced the dose.    Current Outpatient Medications  Medication Sig Dispense Refill   amLODipine (NORVASC) 10 MG tablet Take 10 mg by mouth daily.     atorvastatin (LIPITOR) 20 MG tablet Take 20 mg by mouth daily.     Blood Glucose Monitoring Suppl (ONETOUCH VERIO FLEX SYSTEM) w/Device KIT Use 1 unit as directed once a day for blood glucose testing , E11.9     cetirizine (ZYRTEC) 10 MG tablet Take 10 mg by mouth daily.     Dulaglutide (TRULICITY) 1.5 MG/0.5ML SOPN Inject into the skin.     losartan (COZAAR) 50 MG tablet Take 50 mg by mouth daily.     metformin (FORTAMET) 500 MG (OSM) 24 hr tablet Take 500 mg by mouth daily with breakfast.     omeprazole  (PRILOSEC) 20 MG capsule Take 1 capsule (20 mg total) by mouth daily. 90 capsule 0   ONETOUCH ULTRA TEST test strip      No current facility-administered medications for this visit.    Allergies as of 09/29/2023 - Review Complete 09/29/2023  Allergen Reaction Noted   Hydrochlorothiazide Swelling and Other (See Comments) 10/20/2008   Hydrocodone-acetaminophen Other (See Comments), Nausea Only, and Nausea And Vomiting 11/23/2014   Lisinopril Other (See Comments) 10/20/2008   Codeine Nausea And Vomiting 02/11/2022   Other Other (See Comments) 03/26/2023   Penicillins Nausea And Vomiting 02/11/2022      ROS:  General: Negative for anorexia, weight loss, fever, chills, fatigue, weakness. ENT: Negative for hoarseness, difficulty swallowing , nasal congestion. CV: Negative for chest pain, angina, palpitations, dyspnea on exertion, peripheral edema.  Respiratory: Negative for dyspnea at rest, dyspnea on exertion, cough, sputum, wheezing.  GI: See history of present illness. GU:  Negative for dysuria, hematuria, urinary incontinence, urinary frequency, nocturnal urination.  Endo: Negative for unusual weight change.    Physical Examination:   BP 131/77 (BP Location: Right Arm, Patient Position: Sitting, Cuff Size: Large)   Pulse 66   Temp 97.6 F (36.4 C) (Oral)   Ht 5\' 2"  (1.575 m)   Wt 193 lb 3.2 oz (87.6 kg)   BMI 35.34 kg/m   General: Well-nourished, well-developed in no  acute distress.  Eyes: No icterus. Conjunctivae pink. Mouth: Oropharyngeal mucosa moist and pink , no lesions erythema or exudate. Neuro: Alert and oriented x 3.  Grossly intact. Skin: Warm and dry, no jaundice.   Psych: Alert and cooperative, normal mood and affect.   Imaging Studies: No results found.  Assessment and Plan:   Kaitlin Maxwell is a 72 y.o. y/o female  to follow-up for GERD her symptoms are possibly combination of GERD and dyspepsia with constipation likely due to commencement of Trulicity,  H. pylori that has been treated.    Plan 1.  Since she is already decreased the dose of Prilosec and doing well stop Prilosec commenced on famotidine 40 mg once a day OTC for a month and then stop if she has recurrence of symptoms start taking famotidine 40 mg once a day.  If symptoms still persist come back and see [place] at the office otherwise can follow-up with your primary care provider  Dr [name]  MD,MRCP [facility]) Follow up in as needed

## 2024-02-02 ENCOUNTER — Encounter: Payer: Self-pay | Admitting: Family Medicine

## 2024-05-13 ENCOUNTER — Other Ambulatory Visit: Payer: Self-pay | Admitting: Family Medicine

## 2024-05-13 DIAGNOSIS — M5416 Radiculopathy, lumbar region: Secondary | ICD-10-CM

## 2024-05-27 ENCOUNTER — Other Ambulatory Visit

## 2024-06-01 ENCOUNTER — Other Ambulatory Visit

## 2024-06-02 ENCOUNTER — Ambulatory Visit
Admission: RE | Admit: 2024-06-02 | Discharge: 2024-06-02 | Disposition: A | Discharge status: Home / Self Care | Source: Ambulatory Visit | Attending: Family Medicine | Admitting: Family Medicine

## 2024-06-02 DIAGNOSIS — M5416 Radiculopathy, lumbar region: Secondary | ICD-10-CM
